# Patient Record
Sex: Female | Born: 2005 | Race: White | Hispanic: No | Marital: Single | Smoking: Never smoker
Health system: Southern US, Community
[De-identification: ages and names within clinical notes are randomized; demographics above are authoritative.]

## PROBLEM LIST (undated history)

## (undated) DIAGNOSIS — D649 Anemia, unspecified: Secondary | ICD-10-CM

## (undated) DIAGNOSIS — J302 Other seasonal allergic rhinitis: Secondary | ICD-10-CM

## (undated) DIAGNOSIS — G43909 Migraine, unspecified, not intractable, without status migrainosus: Secondary | ICD-10-CM

## (undated) DIAGNOSIS — J45909 Unspecified asthma, uncomplicated: Secondary | ICD-10-CM

## (undated) HISTORY — DX: Anemia, unspecified: D64.9

## (undated) HISTORY — PX: TONSILLECTOMY: SUR1361

---
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
## 2023-02-14 ENCOUNTER — Encounter: Payer: Self-pay | Admitting: *Deleted

## 2023-05-03 ENCOUNTER — Other Ambulatory Visit: Payer: Self-pay | Admitting: Obstetrics and Gynecology

## 2023-05-03 DIAGNOSIS — N920 Excessive and frequent menstruation with regular cycle: Secondary | ICD-10-CM

## 2023-05-03 DIAGNOSIS — Z3009 Encounter for other general counseling and advice on contraception: Secondary | ICD-10-CM

## 2023-05-04 ENCOUNTER — Encounter: Payer: Self-pay | Admitting: Obstetrics and Gynecology

## 2023-05-04 DIAGNOSIS — N920 Excessive and frequent menstruation with regular cycle: Secondary | ICD-10-CM

## 2023-05-04 DIAGNOSIS — Z3009 Encounter for other general counseling and advice on contraception: Secondary | ICD-10-CM

## 2023-05-04 MED ORDER — JUNEL FE 24 1-20 MG-MCG(24) PO TABS: 1.0000 | ORAL_TABLET | Freq: Every day | ORAL | 3 refills | Status: DC

## 2023-11-07 ENCOUNTER — Ambulatory Visit
Admission: RE | Admit: 2023-11-07 | Discharge: 2023-11-07 | Disposition: A | Discharge status: Home / Self Care | Payer: Managed Care, Other (non HMO) | Source: Ambulatory Visit

## 2023-11-07 VITALS — BP 109/66 | HR 105 | Temp 99.1°F | Resp 16

## 2023-11-07 DIAGNOSIS — R059 Cough, unspecified: Secondary | ICD-10-CM

## 2023-11-07 DIAGNOSIS — J101 Influenza due to other identified influenza virus with other respiratory manifestations: Secondary | ICD-10-CM | POA: Diagnosis not present

## 2023-11-07 LAB — POC INFLUENZA A AND B ANTIGEN (URGENT CARE ONLY)
Influenza A Ag: POSITIVE — AB
Influenza B Ag: NEGATIVE

## 2023-11-07 MED ORDER — BENZONATATE 200 MG PO CAPS: 200.0000 mg | ORAL_CAPSULE | Freq: Three times a day (TID) | ORAL | 0 refills | Status: AC | PRN

## 2023-11-07 MED ORDER — OSELTAMIVIR PHOSPHATE 75 MG PO CAPS: 75.0000 mg | ORAL_CAPSULE | Freq: Two times a day (BID) | ORAL | 0 refills | Status: AC

## 2023-11-07 MED ORDER — PROMETHAZINE-DM 6.25-15 MG/5ML PO SYRP
5.0000 mL | ORAL_SOLUTION | Freq: Two times a day (BID) | ORAL | 0 refills | Status: AC | PRN
Start: 2023-11-07 — End: ?

## 2023-11-07 MED ORDER — PREDNISONE 20 MG PO TABS: ORAL_TABLET | ORAL | 0 refills | Status: AC

## 2023-11-07 NOTE — Discharge Instructions (Addendum)
 Advised patient to take medications as directed with food to completion.  Advised patient to take with first dose of Tamiflu  for the next 5 days.  Advised may use Tessalon  capsules daily or as needed for cough.  Advised may use Promethazine  DM at night for cough prior to sleep due to sedative effects.  Advised patient may take OTC Tylenol 1 g every 6 hours for fever (oral temperature greater than 100.3).  Encouraged increase daily water intake to 64 ounces per day while taking these medications.  Advised if symptoms worsen and/or unresolved please follow-up with PCP or here for further evaluation.

## 2023-11-07 NOTE — ED Provider Notes (Signed)
 [facility]    CSN: [id number] Arrival date & time: 11/07/23  1301      History   Chief Complaint Chief Complaint  Patient presents with   Fever   Nasal Congestion   Chills    HPI Holly Rosales is a 18 y.o. female.   HPI 18 year old female presents with stuffy nose, wet cough, headache chills and low-grade fever for 2 days.  Reports awakening to 102.0 temperature and possible exposure to flu parent PMH significant for mild intermittent asthma, migraine, and allergic rhinitis.  Patient is accompanied by her Father today.  Past Medical History:  Diagnosis Date   Anemia    Asthma    Migraines    Seasonal allergies     Patient Active Problem List   Diagnosis Date Noted   Mild intermittent asthma 05/13/2022   Migraine without aura and without status migrainosus, not intractable 08/26/2021   Allergic rhinitis 08/07/2017    Past Surgical History:  Procedure Laterality Date   TONSILLECTOMY      OB History   No obstetric history on file.      Home Medications    Prior to Admission medications   Medication Sig Start Date End Date Taking? Authorizing Provider  benzonatate  (TESSALON ) 200 MG capsule Take 1 capsule (200 mg total) by mouth 3 (three) times daily as needed for up to 7 days. 11/07/23 11/14/23 Yes Teddy Sharper, FNP  oseltamivir  (TAMIFLU ) 75 MG capsule Take 1 capsule (75 mg total) by mouth every 12 (twelve) hours. 11/07/23  Yes Teddy Sharper, FNP  predniSONE  (DELTASONE ) 20 MG tablet Take 3 tabs PO daily x 5 days. 11/07/23  Yes Teddy Sharper, FNP  promethazine -dextromethorphan (PROMETHAZINE -DM) 6.25-15 MG/5ML syrup Take 5 mLs by mouth 2 (two) times daily as needed for cough. 11/07/23  Yes Teddy Sharper, FNP  atenolol (TENORMIN) 25 MG tablet Take 25 mg by mouth daily.    [provider]  Calcium  Carbonate-Vitamin D  600-400 MG-UNIT tablet Take 1 tablet by mouth 2 (two) times daily. 03/09/21   Curtis Debby PARAS, MD  ferrous sulfate 325 (65 FE) MG  tablet Take 25 mg by mouth at bedtime.    [provider]  FLOVENT HFA 44 MCG/ACT inhaler Inhale 2 puffs into the lungs 2 (two) times daily. 08/08/21   [provider]  hydrOXYzine (ATARAX) 10 MG tablet Take 10 mg by mouth daily as needed for anxiety or headache. 04/26/22   [provider]  levocetirizine (XYZAL) 5 MG tablet Take 1 tablet by mouth every evening.    [provider]  Multiple Vitamin (MULTIVITAMIN ADULT PO) Take 1 tablet by mouth daily.    [provider]  MULTIPLE VITAMIN PO Take by mouth.    [provider]  Norethindrone Acetate-Ethinyl Estrad-FE (JUNEL  FE 24) 1-20 MG-MCG(24) tablet Take 1 tablet by mouth daily. 05/04/23   Cleatus Moccasin, MD  pantoprazole  (PROTONIX ) 40 MG tablet Take 1 tablet (40 mg total) by mouth daily. 01/27/22 02/26/22  Teddy Sharper, FNP  tranexamic acid  (LYSTEDA ) 650 MG TABS tablet Take 2 tablets (1,300 mg total) by mouth 3 (three) times daily. Take during menses for a maximum of five days 05/27/22   Cleatus Moccasin, MD    Family History Family History  Problem Relation Age of Onset   Asthma Father    Healthy Mother    Healthy Sister     Social History Social History   Tobacco Use   Smoking status: Never   Smokeless tobacco: Never  Vaping  Use   Vaping status: Never Used  Substance Use Topics   Alcohol use: No   Drug use: No     Allergies   Patient has no known allergies.   Review of Systems Review of Systems   Physical Exam Triage Vital Signs ED Triage Vitals  Encounter Vitals Group     BP      Systolic BP Percentile      Diastolic BP Percentile      Pulse      Resp      Temp      Temp src      SpO2      Weight      Height      Head Circumference      Peak Flow      Pain Score      Pain Loc      Pain Education      Exclude from Growth Chart    No data found.  Updated Vital Signs BP 109/66 (BP Location: Left Arm)   Pulse (!) 105   Temp 99.1 F (37.3 C) (Oral)    Resp 16   LMP  (LMP Unknown)   SpO2 100%   Visual Acuity Right Eye Distance:   Left Eye Distance:   Bilateral Distance:    Right Eye Near:   Left Eye Near:    Bilateral Near:     Physical Exam Vitals and nursing note reviewed.  Constitutional:      Appearance: Normal appearance. She is normal weight. She is ill-appearing.  HENT:     Head: Normocephalic and atraumatic.     Right Ear: Tympanic membrane, ear canal and external ear normal.     Left Ear: Tympanic membrane, ear canal and external ear normal.     Nose: Nose normal.     Mouth/Throat:     Mouth: Mucous membranes are moist.     Pharynx: Oropharynx is clear.  Eyes:     Extraocular Movements: Extraocular movements intact.     Conjunctiva/sclera: Conjunctivae normal.     Pupils: Pupils are equal, round, and reactive to light.  Cardiovascular:     Rate and Rhythm: Normal rate and regular rhythm.     Pulses: Normal pulses.     Heart sounds: Normal heart sounds.  Pulmonary:     Effort: Pulmonary effort is normal.     Breath sounds: Normal breath sounds. No wheezing, rhonchi or rales.     Comments: Infrequent nonproductive cough on exam Musculoskeletal:        General: Normal range of motion.     Cervical back: Normal range of motion and neck supple.  Skin:    General: Skin is warm and dry.  Neurological:     General: No focal deficit present.     Mental Status: She is alert and oriented to person, place, and time. Mental status is at baseline.  Psychiatric:        Mood and Affect: Mood normal.        Behavior: Behavior normal.      UC Treatments / Results  Labs (all labs ordered are listed, but only abnormal results are displayed) Labs Reviewed  POC INFLUENZA A AND B ANTIGEN (URGENT CARE ONLY) - Abnormal; Notable for the following components:      Result Value   Influenza A Ag Positive (*)    All other components within normal limits    EKG   Radiology No results found.  Procedures Procedures  (  including critical care time)  Medications Ordered in UC Medications - No data to display  Initial Impression / Assessment and Plan / UC Course  I have reviewed the triage vital signs and the nursing notes.  Pertinent labs & imaging results that were available during my care of the patient were reviewed by me and considered in my medical decision making (see chart for details).     MDM: 1.  Influenza A-Rx'd Tamiflu  75 mg capsule: Take 1 capsule twice daily x 5 days; 2.  Cough, unspecified type-Rx prednisone  20 mg tablet: Take 3 tabs p.o. daily x 5 days, Rx'd Tessalon  200 mg capsules: Take 1 capsule 3 times daily, as needed for cough, Rx'd Promethazine  DM 6.25-15 Mg/5 mL syrup: Take 5 mL twice daily, as needed for cough. Advised patient to take medications as directed with food to completion.  Advised patient to take with first dose of Tamiflu  for the next 5 days.  Advised may use Tessalon  capsules daily or as needed for cough.  Advised may use Promethazine  DM at night for cough prior to sleep due to sedative effects.  Advised patient may take OTC Tylenol 1 g every 6 hours for fever (oral temperature greater than 100.3).  Encouraged increase daily water intake to 64 ounces per day while taking these medications.   Patient discharged home, hemodynamically stable. Final Clinical Impressions(s) / UC Diagnoses   Final diagnoses:  Influenza A  Cough, unspecified type     Discharge Instructions      Advised patient to take medications as directed with food to completion.  Advised patient to take with first dose of Tamiflu  for the next 5 days.  Advised may use Tessalon  capsules daily or as needed for cough.  Advised may use Promethazine  DM at night for cough prior to sleep due to sedative effects.  Advised patient may take OTC Tylenol 1 g every 6 hours for fever (oral temperature greater than 100.3).  Encouraged increase daily water intake to 64 ounces per day while taking these medications.   Advised if symptoms worsen and/or unresolved please follow-up with PCP or here for further evaluation.     ED Prescriptions     Medication Sig Dispense Auth. Provider   oseltamivir  (TAMIFLU ) 75 MG capsule Take 1 capsule (75 mg total) by mouth every 12 (twelve) hours. 10 capsule Isatou Agredano, FNP   benzonatate  (TESSALON ) 200 MG capsule Take 1 capsule (200 mg total) by mouth 3 (three) times daily as needed for up to 7 days. 40 capsule Quinisha Mould, FNP   promethazine -dextromethorphan (PROMETHAZINE -DM) 6.25-15 MG/5ML syrup Take 5 mLs by mouth 2 (two) times daily as needed for cough. 118 mL Teddy Sharper, FNP   predniSONE  (DELTASONE ) 20 MG tablet Take 3 tabs PO daily x 5 days. 15 tablet Sayre Mazor, FNP      PDMP not reviewed this encounter.   Teddy Sharper, FNP 11/07/23 1339

## 2023-11-07 NOTE — ED Triage Notes (Signed)
 Pt c/o runny nose, cough and fever x 2 days. Woke up with 102 fever. Tylenol prn.

## 2023-12-03 ENCOUNTER — Other Ambulatory Visit: Payer: Self-pay | Admitting: Obstetrics and Gynecology

## 2023-12-03 DIAGNOSIS — N920 Excessive and frequent menstruation with regular cycle: Secondary | ICD-10-CM

## 2023-12-03 DIAGNOSIS — Z3009 Encounter for other general counseling and advice on contraception: Secondary | ICD-10-CM

## 2024-03-14 ENCOUNTER — Other Ambulatory Visit: Payer: Self-pay

## 2024-03-14 DIAGNOSIS — Z3009 Encounter for other general counseling and advice on contraception: Secondary | ICD-10-CM

## 2024-03-14 DIAGNOSIS — N920 Excessive and frequent menstruation with regular cycle: Secondary | ICD-10-CM

## 2024-03-14 MED ORDER — JUNEL FE 24 1-20 MG-MCG(24) PO TABS: 1.0000 | ORAL_TABLET | Freq: Every day | ORAL | 3 refills | Status: AC

## 2024-11-17 DIAGNOSIS — N920 Excessive and frequent menstruation with regular cycle: Secondary | ICD-10-CM

## 2024-11-17 DIAGNOSIS — Z3009 Encounter for other general counseling and advice on contraception: Secondary | ICD-10-CM
